# Patient Record
Sex: Male | Born: 1990 | Race: White | Hispanic: No | Marital: Single | Smoking: Never smoker
Health system: Southern US, Community
[De-identification: ages and names within clinical notes are randomized; demographics above are authoritative.]

## PROBLEM LIST (undated history)

## (undated) HISTORY — PX: DEEP NECK LYMPH NODE BIOPSY / EXCISION: SUR126

---

## 2013-05-01 ENCOUNTER — Encounter (HOSPITAL_COMMUNITY): Payer: Managed Care, Other (non HMO) | Admitting: Anesthesiology

## 2013-05-01 ENCOUNTER — Observation Stay (HOSPITAL_COMMUNITY): Payer: Managed Care, Other (non HMO) | Admitting: Anesthesiology

## 2013-05-01 ENCOUNTER — Emergency Department (HOSPITAL_COMMUNITY): Payer: Managed Care, Other (non HMO)

## 2013-05-01 ENCOUNTER — Ambulatory Visit (HOSPITAL_COMMUNITY)
Admission: EM | Admit: 2013-05-01 | Discharge: 2013-05-02 | Disposition: A | Discharge status: Home / Self Care | Payer: Managed Care, Other (non HMO) | Attending: Orthopedic Surgery | Admitting: Orthopedic Surgery

## 2013-05-01 ENCOUNTER — Encounter (HOSPITAL_COMMUNITY)
Admission: EM | Disposition: A | Discharge status: Home / Self Care | Payer: Self-pay | Source: Home / Self Care | Attending: Emergency Medicine

## 2013-05-01 ENCOUNTER — Other Ambulatory Visit (HOSPITAL_COMMUNITY): Payer: Self-pay | Admitting: Orthopedic Surgery

## 2013-05-01 ENCOUNTER — Encounter (HOSPITAL_COMMUNITY): Payer: Self-pay | Admitting: Emergency Medicine

## 2013-05-01 DIAGNOSIS — S82853A Displaced trimalleolar fracture of unspecified lower leg, initial encounter for closed fracture: Secondary | ICD-10-CM | POA: Diagnosis present

## 2013-05-01 DIAGNOSIS — S82892A Other fracture of left lower leg, initial encounter for closed fracture: Secondary | ICD-10-CM | POA: Diagnosis present

## 2013-05-01 DIAGNOSIS — S93439A Sprain of tibiofibular ligament of unspecified ankle, initial encounter: Secondary | ICD-10-CM | POA: Insufficient documentation

## 2013-05-01 DIAGNOSIS — S82892B Other fracture of left lower leg, initial encounter for open fracture type I or II: Secondary | ICD-10-CM

## 2013-05-01 HISTORY — PX: ORIF ANKLE FRACTURE: SHX5408

## 2013-05-01 HISTORY — PX: ANKLE FRACTURE SURGERY: SHX122

## 2013-05-01 LAB — CBC WITH DIFFERENTIAL/PLATELET
BASOS PCT: 0 % (ref 0–1)
Basophils Absolute: 0 10*3/uL (ref 0.0–0.1)
Eosinophils Absolute: 0 10*3/uL (ref 0.0–0.7)
Eosinophils Relative: 0 % (ref 0–5)
HCT: 52.6 % — ABNORMAL HIGH (ref 39.0–52.0)
Hemoglobin: 18.5 g/dL — ABNORMAL HIGH (ref 13.0–17.0)
LYMPHS ABS: 1.3 10*3/uL (ref 0.7–4.0)
Lymphocytes Relative: 13 % (ref 12–46)
MCH: 29 pg (ref 26.0–34.0)
MCHC: 35.2 g/dL (ref 30.0–36.0)
MCV: 82.4 fL (ref 78.0–100.0)
MONOS PCT: 4 % (ref 3–12)
Monocytes Absolute: 0.4 10*3/uL (ref 0.1–1.0)
NEUTROS ABS: 7.9 10*3/uL — AB (ref 1.7–7.7)
Neutrophils Relative %: 83 % — ABNORMAL HIGH (ref 43–77)
PLATELETS: 234 10*3/uL (ref 150–400)
RBC: 6.38 MIL/uL — AB (ref 4.22–5.81)
RDW: 13.6 % (ref 11.5–15.5)
WBC: 9.6 10*3/uL (ref 4.0–10.5)

## 2013-05-01 LAB — COMPREHENSIVE METABOLIC PANEL
ALK PHOS: 58 U/L (ref 39–117)
ALT: 77 U/L — ABNORMAL HIGH (ref 0–53)
AST: 48 U/L — ABNORMAL HIGH (ref 0–37)
Albumin: 5.1 g/dL (ref 3.5–5.2)
BILIRUBIN TOTAL: 0.6 mg/dL (ref 0.3–1.2)
BUN: 11 mg/dL (ref 6–23)
CHLORIDE: 99 meq/L (ref 96–112)
CO2: 23 mEq/L (ref 19–32)
Calcium: 9.9 mg/dL (ref 8.4–10.5)
Creatinine, Ser: 0.87 mg/dL (ref 0.50–1.35)
GFR calc Af Amer: >90 mL/min (ref >90)
GFR calc non Af Amer: >90 mL/min (ref >90)
Glucose, Bld: 111 mg/dL — ABNORMAL HIGH (ref 70–99)
Potassium: 4 mEq/L (ref 3.7–5.3)
Sodium: 143 mEq/L (ref 137–147)
Total Protein: 9.1 g/dL — ABNORMAL HIGH (ref 6.0–8.3)

## 2013-05-01 SURGERY — OPEN REDUCTION INTERNAL FIXATION (ORIF) ANKLE FRACTURE
Anesthesia: General | Site: Ankle | Laterality: Left

## 2013-05-01 MED ORDER — FENTANYL CITRATE 0.05 MG/ML IJ SOLN
INTRAMUSCULAR | Status: AC
  Filled 2013-05-01: qty 5

## 2013-05-01 MED ORDER — OXYCODONE HCL 5 MG/5ML PO SOLN: 5.0000 mg | Freq: Once | ORAL | Status: AC | PRN

## 2013-05-01 MED ORDER — FENTANYL CITRATE 0.05 MG/ML IJ SOLN
INTRAMUSCULAR | Status: AC
  Administered 2013-05-01: 100 ug via INTRAVENOUS
  Filled 2013-05-01: qty 2

## 2013-05-01 MED ORDER — METOCLOPRAMIDE HCL 10 MG PO TABS: 5.0000 mg | ORAL_TABLET | Freq: Three times a day (TID) | ORAL | Status: DC | PRN

## 2013-05-01 MED ORDER — ONDANSETRON HCL 4 MG PO TABS: 4.0000 mg | ORAL_TABLET | Freq: Four times a day (QID) | ORAL | Status: DC | PRN

## 2013-05-01 MED ORDER — METHOCARBAMOL 500 MG PO TABS
ORAL_TABLET | ORAL | Status: AC
  Filled 2013-05-01: qty 1

## 2013-05-01 MED ORDER — SODIUM CHLORIDE 0.9 % IV SOLN: INTRAVENOUS | Status: DC

## 2013-05-01 MED ORDER — HYDROMORPHONE HCL PF 1 MG/ML IJ SOLN
INTRAMUSCULAR | Status: AC
  Filled 2013-05-01: qty 1

## 2013-05-01 MED ORDER — BUPIVACAINE-EPINEPHRINE PF 0.5-1:200000 % IJ SOLN
INTRAMUSCULAR | Status: DC | PRN
  Administered 2013-05-01: 45 mL

## 2013-05-01 MED ORDER — SUCCINYLCHOLINE CHLORIDE 20 MG/ML IJ SOLN
INTRAMUSCULAR | Status: DC | PRN
  Administered 2013-05-01: 150 mg via INTRAVENOUS

## 2013-05-01 MED ORDER — METOCLOPRAMIDE HCL 5 MG/ML IJ SOLN: 5.0000 mg | Freq: Three times a day (TID) | INTRAMUSCULAR | Status: DC | PRN

## 2013-05-01 MED ORDER — ASPIRIN EC 325 MG PO TBEC
325.0000 mg | DELAYED_RELEASE_TABLET | Freq: Every day | ORAL | Status: DC
  Administered 2013-05-02 (×2): 325 mg via ORAL
  Filled 2013-05-01 (×2): qty 1

## 2013-05-01 MED ORDER — FENTANYL CITRATE 0.05 MG/ML IJ SOLN
INTRAMUSCULAR | Status: DC | PRN
  Administered 2013-05-01: 250 ug via INTRAVENOUS

## 2013-05-01 MED ORDER — HYDROMORPHONE HCL PF 1 MG/ML IJ SOLN: 1.0000 mg | INTRAMUSCULAR | Status: DC | PRN

## 2013-05-01 MED ORDER — CEFAZOLIN SODIUM 1-5 GM-% IV SOLN
1.0000 g | Freq: Once | INTRAVENOUS | Status: AC
  Administered 2013-05-01: 1 g via INTRAVENOUS
  Filled 2013-05-01: qty 50

## 2013-05-01 MED ORDER — ONDANSETRON HCL 4 MG/2ML IJ SOLN: 4.0000 mg | Freq: Four times a day (QID) | INTRAMUSCULAR | Status: DC | PRN

## 2013-05-01 MED ORDER — MIDAZOLAM HCL 2 MG/2ML IJ SOLN
INTRAMUSCULAR | Status: AC
  Filled 2013-05-01: qty 2

## 2013-05-01 MED ORDER — LIDOCAINE HCL (CARDIAC) 20 MG/ML IV SOLN: INTRAVENOUS | Status: DC | PRN

## 2013-05-01 MED ORDER — OXYCODONE-ACETAMINOPHEN 5-325 MG PO TABS
1.0000 | ORAL_TABLET | ORAL | Status: DC | PRN
  Administered 2013-05-02: 1 via ORAL
  Filled 2013-05-01: qty 1

## 2013-05-01 MED ORDER — LACTATED RINGERS IV SOLN
INTRAVENOUS | Status: DC | PRN
  Administered 2013-05-01 (×2): via INTRAVENOUS

## 2013-05-01 MED ORDER — ONDANSETRON HCL 4 MG/2ML IJ SOLN
INTRAMUSCULAR | Status: DC | PRN
  Administered 2013-05-01: 4 mg via INTRAVENOUS

## 2013-05-01 MED ORDER — HYDROMORPHONE HCL PF 1 MG/ML IJ SOLN: 0.2500 mg | INTRAMUSCULAR | Status: DC | PRN

## 2013-05-01 MED ORDER — SUCCINYLCHOLINE CHLORIDE 20 MG/ML IJ SOLN: INTRAMUSCULAR | Status: DC | PRN

## 2013-05-01 MED ORDER — OXYCODONE HCL 5 MG PO TABS
5.0000 mg | ORAL_TABLET | Freq: Once | ORAL | Status: AC | PRN
  Administered 2013-05-01: 5 mg via ORAL

## 2013-05-01 MED ORDER — HYDROMORPHONE HCL PF 1 MG/ML IJ SOLN
1.0000 mg | Freq: Once | INTRAMUSCULAR | Status: AC
  Administered 2013-05-01: 1 mg via INTRAVENOUS
  Filled 2013-05-01: qty 1

## 2013-05-01 MED ORDER — PROPOFOL 10 MG/ML IV BOLUS
INTRAVENOUS | Status: AC
  Filled 2013-05-01: qty 20

## 2013-05-01 MED ORDER — CEFAZOLIN SODIUM-DEXTROSE 2-3 GM-% IV SOLR
INTRAVENOUS | Status: DC | PRN
  Administered 2013-05-01: 3 g via INTRAVENOUS

## 2013-05-01 MED ORDER — PROPOFOL 10 MG/ML IV BOLUS: INTRAVENOUS | Status: DC | PRN

## 2013-05-01 MED ORDER — HYDROMORPHONE HCL PF 1 MG/ML IJ SOLN
0.5000 mg | INTRAMUSCULAR | Status: DC | PRN
  Administered 2013-05-02: 1 mg via INTRAVENOUS
  Filled 2013-05-01: qty 1

## 2013-05-01 MED ORDER — MIDAZOLAM HCL 2 MG/2ML IJ SOLN
INTRAMUSCULAR | Status: AC
  Administered 2013-05-01: 2 mg via INTRAVENOUS
  Filled 2013-05-01: qty 2

## 2013-05-01 MED ORDER — FENTANYL CITRATE 0.05 MG/ML IJ SOLN: INTRAMUSCULAR | Status: DC | PRN

## 2013-05-01 MED ORDER — CEFAZOLIN SODIUM-DEXTROSE 2-3 GM-% IV SOLR
2.0000 g | Freq: Four times a day (QID) | INTRAVENOUS | Status: AC
  Administered 2013-05-02 (×3): 2 g via INTRAVENOUS
  Filled 2013-05-01 (×3): qty 50

## 2013-05-01 MED ORDER — LIDOCAINE HCL (CARDIAC) 20 MG/ML IV SOLN
INTRAVENOUS | Status: DC | PRN
  Administered 2013-05-01: 100 mg via INTRAVENOUS

## 2013-05-01 MED ORDER — DEXTROSE 5 % IV SOLN
500.0000 mg | Freq: Four times a day (QID) | INTRAVENOUS | Status: DC | PRN
  Filled 2013-05-01: qty 5

## 2013-05-01 MED ORDER — OXYCODONE HCL 5 MG PO TABS
ORAL_TABLET | ORAL | Status: AC
  Filled 2013-05-01: qty 1

## 2013-05-01 MED ORDER — MUPIROCIN 2 % EX OINT
TOPICAL_OINTMENT | CUTANEOUS | Status: AC
Start: 2013-05-01 — End: 2013-05-01
  Filled 2013-05-01: qty 22

## 2013-05-01 MED ORDER — PROMETHAZINE HCL 25 MG/ML IJ SOLN: 6.2500 mg | INTRAMUSCULAR | Status: DC | PRN

## 2013-05-01 MED ORDER — DEXAMETHASONE SODIUM PHOSPHATE 10 MG/ML IJ SOLN
INTRAMUSCULAR | Status: DC | PRN
Start: 2013-05-01 — End: 2013-05-01
  Administered 2013-05-01: 6 mg

## 2013-05-01 MED ORDER — ETOMIDATE 2 MG/ML IV SOLN
0.3000 mg/kg | Freq: Once | INTRAVENOUS | Status: AC
  Administered 2013-05-01: 10 mg via INTRAVENOUS
  Filled 2013-05-01: qty 30

## 2013-05-01 MED ORDER — 0.9 % SODIUM CHLORIDE (POUR BTL) OPTIME
TOPICAL | Status: DC | PRN
  Administered 2013-05-01: 1000 mL

## 2013-05-01 MED ORDER — PROPOFOL 10 MG/ML IV BOLUS
INTRAVENOUS | Status: DC | PRN
  Administered 2013-05-01: 300 mg via INTRAVENOUS

## 2013-05-01 MED ORDER — METHOCARBAMOL 500 MG PO TABS
500.0000 mg | ORAL_TABLET | Freq: Four times a day (QID) | ORAL | Status: DC | PRN
  Administered 2013-05-01: 500 mg via ORAL
  Filled 2013-05-01: qty 1

## 2013-05-01 SURGICAL SUPPLY — 50 items
BANDAGE ESMARK 6X9 LF (GAUZE/BANDAGES/DRESSINGS) IMPLANT
BIT DRILL 2.5X110 QC LCP DISP (BIT) ×3 IMPLANT
BNDG COHESIVE 4X5 TAN STRL (GAUZE/BANDAGES/DRESSINGS) ×3 IMPLANT
BNDG ESMARK 6X9 LF (GAUZE/BANDAGES/DRESSINGS)
BNDG GAUZE STRTCH 6 (GAUZE/BANDAGES/DRESSINGS) ×3 IMPLANT
COVER SURGICAL LIGHT HANDLE (MISCELLANEOUS) ×3 IMPLANT
CUFF TOURNIQUET SINGLE 34IN LL (TOURNIQUET CUFF) IMPLANT
CUFF TOURNIQUET SINGLE 44IN (TOURNIQUET CUFF) IMPLANT
DRAPE C-ARM MINI 42X72 WSTRAPS (DRAPES) IMPLANT
DRAPE INCISE IOBAN 66X45 STRL (DRAPES) ×3 IMPLANT
DRAPE PROXIMA HALF (DRAPES) ×3 IMPLANT
DRAPE U-SHAPE 47X51 STRL (DRAPES) ×3 IMPLANT
DRSG ADAPTIC 3X8 NADH LF (GAUZE/BANDAGES/DRESSINGS) ×3 IMPLANT
DURAPREP 26ML APPLICATOR (WOUND CARE) ×3 IMPLANT
ELECT REM PT RETURN 9FT ADLT (ELECTROSURGICAL) ×3
ELECTRODE REM PT RTRN 9FT ADLT (ELECTROSURGICAL) ×1 IMPLANT
GLOVE BIOGEL PI IND STRL 9 (GLOVE) ×1 IMPLANT
GLOVE BIOGEL PI INDICATOR 9 (GLOVE) ×2
GLOVE SURG ORTHO 9.0 STRL STRW (GLOVE) ×3 IMPLANT
GOWN STRL REUS W/ TWL XL LVL3 (GOWN DISPOSABLE) ×3 IMPLANT
GOWN STRL REUS W/TWL XL LVL3 (GOWN DISPOSABLE) ×6
GUIDEWARE NON THREAD 1.25X150 (WIRE) ×9
GUIDEWIRE NON THREAD 1.25X150 (WIRE) ×3 IMPLANT
KIT BASIN OR (CUSTOM PROCEDURE TRAY) ×3 IMPLANT
KIT ROOM TURNOVER OR (KITS) ×3 IMPLANT
MANIFOLD NEPTUNE II (INSTRUMENTS) ×3 IMPLANT
NS IRRIG 1000ML POUR BTL (IV SOLUTION) ×3 IMPLANT
PACK ORTHO EXTREMITY (CUSTOM PROCEDURE TRAY) ×3 IMPLANT
PAD ARMBOARD 7.5X6 YLW CONV (MISCELLANEOUS) ×6 IMPLANT
PADDING CAST COTTON 6X4 STRL (CAST SUPPLIES) ×3 IMPLANT
PLATE LCP 3.5 1/3 TUB 10HX117 (Plate) ×3 IMPLANT
SCREW CORTEX 3.5 12MM (Screw) ×8 IMPLANT
SCREW CORTEX 3.5 14MM (Screw) ×2 IMPLANT
SCREW CORTEX 3.5 40MM (Screw) ×2 IMPLANT
SCREW CORTEX 3.5 50MM (Screw) ×3 IMPLANT
SCREW LOCK CORT ST 3.5X12 (Screw) ×4 IMPLANT
SCREW LOCK CORT ST 3.5X14 (Screw) ×1 IMPLANT
SCREW LOCK CORT ST 3.5X40 (Screw) ×1 IMPLANT
SCREW SHORT THREAD 4.0X40 (Screw) ×3 IMPLANT
SPONGE GAUZE 4X4 12PLY (GAUZE/BANDAGES/DRESSINGS) ×3 IMPLANT
SPONGE LAP 18X18 X RAY DECT (DISPOSABLE) ×3 IMPLANT
STAPLER VISISTAT 35W (STAPLE) IMPLANT
SUCTION FRAZIER TIP 10 FR DISP (SUCTIONS) ×3 IMPLANT
SUT ETHILON 2 0 PSLX (SUTURE) IMPLANT
SUT VIC AB 2-0 CTB1 (SUTURE) ×6 IMPLANT
TOWEL OR 17X24 6PK STRL BLUE (TOWEL DISPOSABLE) ×3 IMPLANT
TOWEL OR 17X26 10 PK STRL BLUE (TOWEL DISPOSABLE) ×3 IMPLANT
TUBE CONNECTING 12'X1/4 (SUCTIONS) ×1
TUBE CONNECTING 12X1/4 (SUCTIONS) ×2 IMPLANT
WATER STERILE IRR 1000ML POUR (IV SOLUTION) ×3 IMPLANT
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
## 2013-05-01 NOTE — Sedation Documentation (Signed)
EDP, PA, and ortho tech at bedside. Time out performed.
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
## 2013-05-01 NOTE — Anesthesia Procedure Notes (Addendum)
Anesthesia Regional Block:  Popliteal block  Pre-Anesthetic Checklist: ,, timeout performed, Correct Patient, Correct Site, Correct Laterality, Correct Procedure, Correct Position, site marked, Risks and benefits discussed,  Surgical consent,  Pre-op evaluation,  At surgeon's request and post-op pain management   Prep: chloraprep       Needles:  Injection technique: Single-shot  Needle Type: Echogenic Stimulator Needle     Needle Length: 9cm 9 cm Needle Gauge: 22 and 22 G    Additional Needles:  Procedures: nerve stimulator Popliteal block  Nerve Stimulator or Paresthesia:  Response: 0.5 mA,   Additional Responses:   Narrative:  Start time: 05/01/2013 5:42 PM End time: 05/01/2013 5:54 PM Injection made incrementally with aspirations every 5 mL. Anesthesiologist: Dr Gypsy BalsamKasik  Additional Notes: 1191-4782: 1742-1754 L Popliteal Nerve Block POP CHG prep, sterile tech #22 stim/echo needle with stim down to .5ma Somewhat difficult due to pts size and needle length(pt >400#) Multiple neg asp Vernia BuffMarc .5% w/epi 1:200000 45cc+decadron 6mg  infiltrated No compl Dr Gypsy BalsamKasik   Procedure Name: Intubation Date/Time: 05/01/2013 6:07 PM Performed by: Gwenyth AllegraADAMI, Blessings Inglett Pre-anesthesia Checklist: Emergency Drugs available, Timeout performed, Patient identified, Patient being monitored and Suction available Patient Re-evaluated:Patient Re-evaluated prior to inductionOxygen Delivery Method: Circle system utilized and Ambu bag Preoxygenation: Pre-oxygenation with 100% oxygen Intubation Type: IV induction Ventilation: Mask ventilation without difficulty Laryngoscope Size: Mac and 4 Grade View: Grade I Tube type: Oral Tube size: 8.0 mm Number of attempts: 1 Airway Equipment and Method: Patient positioned with wedge pillow and Stylet Secured at: 23 cm Tube secured with: Tape Dental Injury: Teeth and Oropharynx as per pre-operative assessment

## 2013-05-01 NOTE — ED Notes (Signed)
Report rec'd from Brittney C.  Pt to transfer to C-27.

## 2013-05-01 NOTE — Anesthesia Preprocedure Evaluation (Signed)
Anesthesia Evaluation  Patient identified by MRN, date of birth, ID band Patient awake    Reviewed: Allergy & Precautions, H&P , NPO status , Patient's Chart, lab work & pertinent test results  Airway Mallampati: II TM Distance: >3 FB Neck ROM: Full    Dental   Pulmonary  breath sounds clear to auscultation        Cardiovascular Rhythm:Regular Rate:Normal     Neuro/Psych    GI/Hepatic   Endo/Other  Morbid obesity  Renal/GU      Musculoskeletal   Abdominal (+) + obese,   Peds  Hematology   Anesthesia Other Findings   Reproductive/Obstetrics                           Anesthesia Physical Anesthesia Plan  ASA: II  Anesthesia Plan: General   Post-op Pain Management:    Induction: Intravenous  Airway Management Planned: Oral ETT  Additional Equipment:   Intra-op Plan:   Post-operative Plan: Extubation in OR  Informed Consent: I have reviewed the patients History and Physical, chart, labs and discussed the procedure including the risks, benefits and alternatives for the proposed anesthesia with the patient or authorized representative who has indicated his/her understanding and acceptance.     Plan Discussed with: CRNA and Surgeon  Anesthesia Plan Comments:         Anesthesia Quick Evaluation

## 2013-05-01 NOTE — Sedation Documentation (Addendum)
Xray and lab at bedside.

## 2013-05-01 NOTE — ED Notes (Signed)
Homero FellersFrank from pharmacy states he verified pt's allergies and weight and the RN should be able to remove etomidate from Pod E pyxis
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
## 2013-05-01 NOTE — Progress Notes (Signed)
Orthopedic Tech Progress Note Patient Details:  Steven Woods [birth year] [id number]  Ortho Devices Type of Ortho Device: Crutches;Post (short leg) splint;Stirrup splint;Ace wrap Ortho Device/Splint Interventions: Application   Cammer, Mickie BailJennifer Carol 05/01/2013, 12:59 PM

## 2013-05-01 NOTE — ED Notes (Signed)
Ortho called and returned page.  

## 2013-05-01 NOTE — Transfer of Care (Signed)
Immediate Anesthesia Transfer of Care Note  Patient: Steven Woods  Procedure(s) Performed: Procedure(s) with comments: OPEN REDUCTION INTERNAL FIXATION (ORIF) ANKLE FRACTURE (Left) - Open Reduction Internal Fixation Left Ankle and Syndesmosis  Patient Location: PACU  Anesthesia Type:GA combined with regional for post-op pain  Level of Consciousness: awake and alert   Airway & Oxygen Therapy: Patient Spontanous Breathing and Patient connected to nasal cannula oxygen  Post-op Assessment: Report given to PACU RN and Post -op Vital signs reviewed and stable  Post vital signs: Reviewed and stable  Complications: No apparent anesthesia complications
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
## 2013-05-01 NOTE — Anesthesia Postprocedure Evaluation (Signed)
Anesthesia Post Note  Patient: Steven Woods  Procedure(s) Performed: Procedure(s) (LRB): OPEN REDUCTION INTERNAL FIXATION (ORIF) ANKLE FRACTURE (Left)  Anesthesia type: general  Patient location: PACU  Post pain: Pain level controlled  Post assessment: Patient's Cardiovascular Status Stable  Last Vitals:  Filed Vitals:   05/01/13 2149  BP: 153/82  Pulse: 96  Temp: 37 C  Resp: 18    Post vital signs: Reviewed and stable  Level of consciousness: sedated  Complications: No apparent anesthesia complications

## 2013-05-02 ENCOUNTER — Encounter (HOSPITAL_COMMUNITY): Payer: Self-pay | Admitting: General Practice

## 2013-05-02 MED ORDER — OXYCODONE-ACETAMINOPHEN 5-325 MG PO TABS
1.0000 | ORAL_TABLET | ORAL | Status: AC | PRN
Start: 2013-05-02 — End: ?

## 2013-05-02 MED ORDER — INFLUENZA VAC SPLIT QUAD 0.5 ML IM SUSP: 0.5000 mL | INTRAMUSCULAR | Status: DC

## 2013-05-02 MED ORDER — ASPIRIN EC 325 MG PO TBEC: 325.0000 mg | DELAYED_RELEASE_TABLET | Freq: Every day | ORAL | Status: AC
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
## 2013-05-02 NOTE — Discharge Summary (Signed)
   discharge to home in stable condition status post open reduction internal fixation for left trimalleolar ankle fracture including internal fixation for the syndesmosis. Followup in the office in one week. Prescription for Percocet for pain. Prescription for aspirin for DVT prophylaxis. Nonweightbearing left foot.

## 2013-05-02 NOTE — Discharge Instructions (Signed)
Nonweightbearing left foot. Work on range of motion left ankle.: Dorsiflexion specifically. Ice elevation left ankle. Use fracture boot for ambulation. Keep dressing clean dry and intact.

## 2013-05-02 NOTE — Progress Notes (Signed)
Patient d/c to home with family, IV removed, prescriptions given and instructions reviewed.

## 2013-05-02 NOTE — Progress Notes (Signed)
Orthopedic Tech Progress Note Patient Details:  Steven Woods 1990-12-13 [id number]  Ortho Devices Type of Ortho Device: Crutches Ortho Device/Splint Interventions: Application   Shawnie PonsCammer, Merilyn Pagan Carol 05/02/2013, 10:46 AM
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
## 2013-05-05 NOTE — ED Provider Notes (Signed)
Medical screening examination/treatment/procedure(s) were conducted as a shared visit with non-physician practitioner(s) and myself.  I personally evaluated the patient during the encounter.   EKG Interpretation None       Toy BakerAnthony T Saharsh Sterling, MD 05/05/13 [phone]

## 2013-05-25 NOTE — ED Provider Notes (Deleted)
Medical screening examination/treatment/procedure(s) were performed by non-physician practitioner and as supervising physician I was immediately available for consultation/collaboration.   EKG Interpretation None       Leani Myron T Giuseppina Quinones, MD 05/25/13 1419 

## 2016-01-02 IMAGING — CR DG ANKLE 2V *L*
2 series · 2 of 2 positions shown · non-contrast
Comparison: 05/01/2013

CLINICAL DATA: Left ankle fracture dislocation, post reduction

EXAM:
LEFT ANKLE - 2 VIEW

[AP]
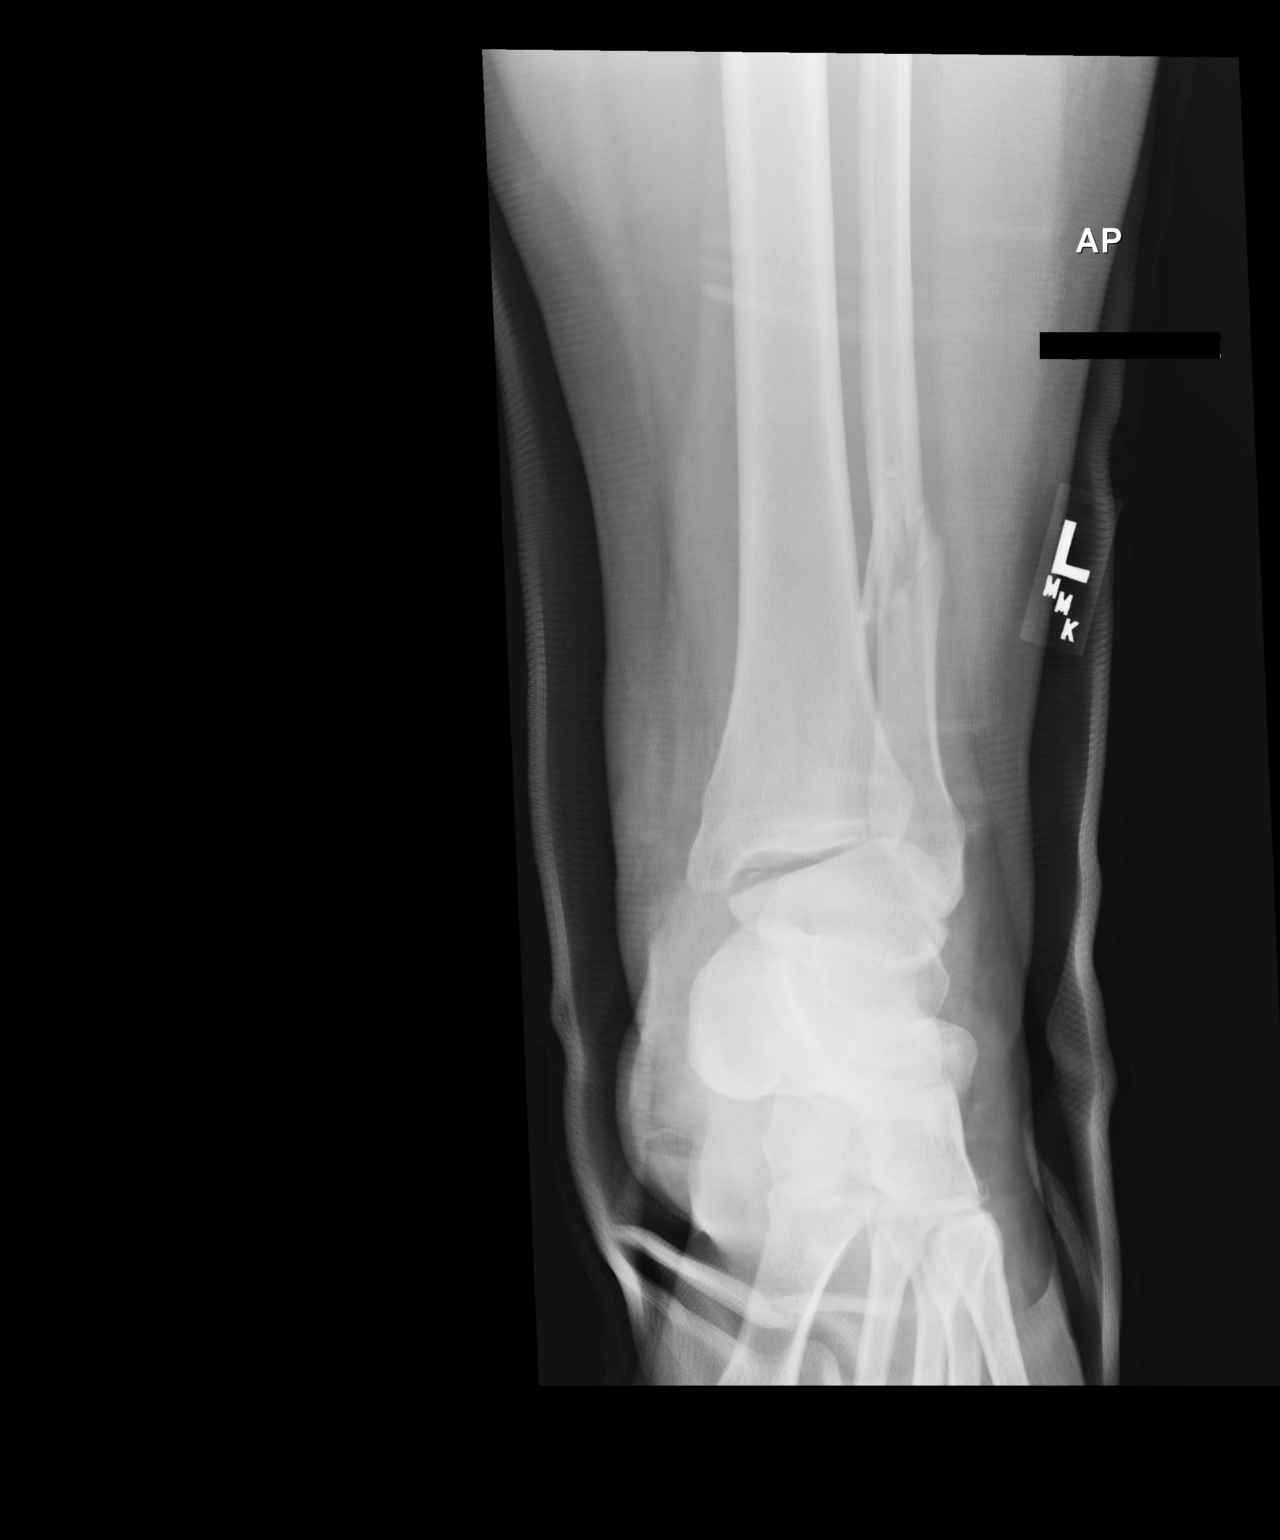

[lateral]
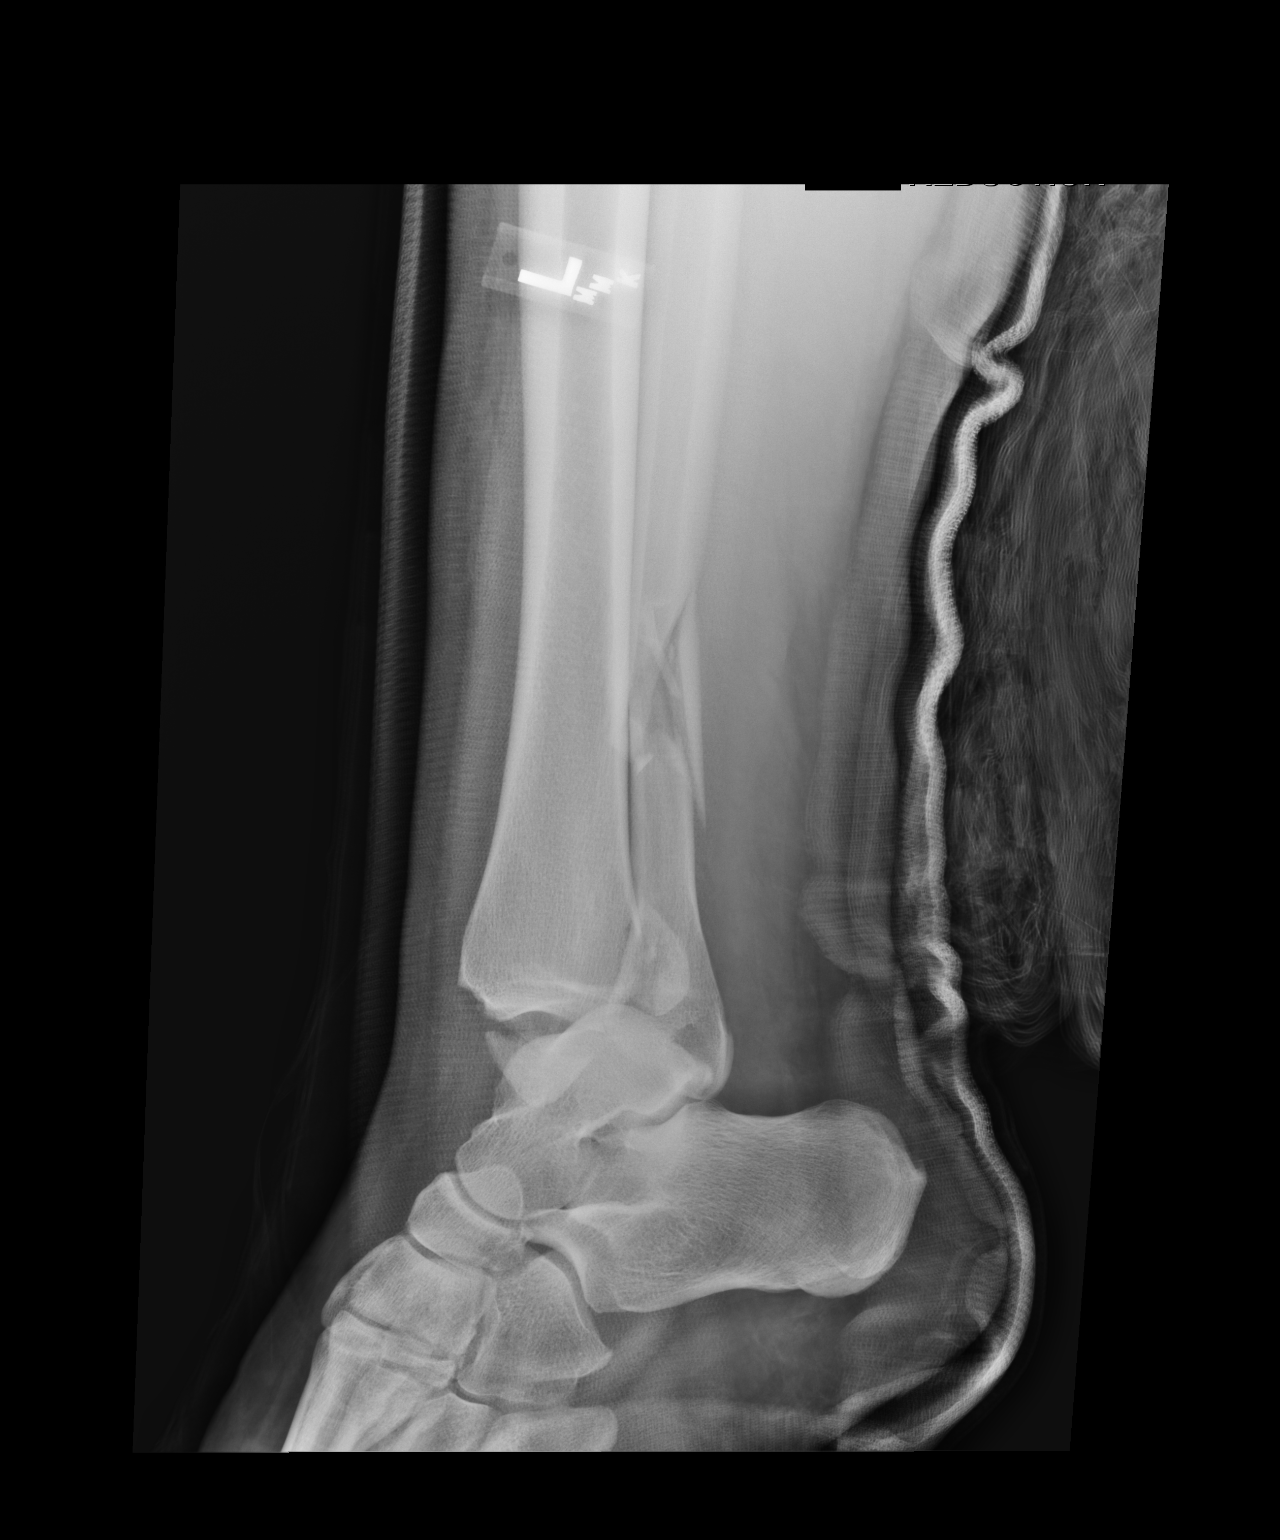

[2 of 2 positions shown; findings below may reference images not displayed]

FINDINGS: Cast material is noted about the left ankle. There is improved
alignment of the tibia in relation to the talus with some residual
anterior medial subluxation, best appreciated on the lateral view.
Medial and posterior malleolar fragments remain displaced. Distal
fibula comminuted fracture demonstrates improved alignment also with
some residual angulation. Diffuse soft tissue swelling.
IMPRESSION: Improved alignment of the left ankle tibiotalar joint with some
residual anterior medial subluxation.

Associated displaced fractures of the posterior and medial malleoli

Associated distal fibula displaced fracture with a butterfly
fragment.
